# Patient Record
Sex: Male | Born: 1965 | Race: White | Hispanic: No | Marital: Married
Health system: Southern US, Community
[De-identification: ages and names within clinical notes are randomized; demographics above are authoritative.]

---

## 2015-05-25 ENCOUNTER — Emergency Department (HOSPITAL_COMMUNITY)
Admission: EM | Admit: 2015-05-25 | Discharge: 2015-05-26 | Disposition: A | Discharge status: Home / Self Care | Payer: BLUE CROSS/BLUE SHIELD | Attending: Emergency Medicine | Admitting: Emergency Medicine

## 2015-05-25 ENCOUNTER — Encounter (HOSPITAL_COMMUNITY): Payer: Self-pay | Admitting: Radiology

## 2015-05-25 ENCOUNTER — Emergency Department (HOSPITAL_COMMUNITY): Payer: BLUE CROSS/BLUE SHIELD

## 2015-05-25 DIAGNOSIS — S0093XA Contusion of unspecified part of head, initial encounter: Secondary | ICD-10-CM | POA: Insufficient documentation

## 2015-05-25 DIAGNOSIS — Y999 Unspecified external cause status: Secondary | ICD-10-CM | POA: Insufficient documentation

## 2015-05-25 DIAGNOSIS — S90811A Abrasion, right foot, initial encounter: Secondary | ICD-10-CM | POA: Diagnosis not present

## 2015-05-25 DIAGNOSIS — Y939 Activity, unspecified: Secondary | ICD-10-CM | POA: Insufficient documentation

## 2015-05-25 DIAGNOSIS — T07XXXA Unspecified multiple injuries, initial encounter: Secondary | ICD-10-CM

## 2015-05-25 DIAGNOSIS — R55 Syncope and collapse: Secondary | ICD-10-CM | POA: Insufficient documentation

## 2015-05-25 DIAGNOSIS — R109 Unspecified abdominal pain: Secondary | ICD-10-CM | POA: Insufficient documentation

## 2015-05-25 DIAGNOSIS — Z23 Encounter for immunization: Secondary | ICD-10-CM | POA: Insufficient documentation

## 2015-05-25 DIAGNOSIS — Y929 Unspecified place or not applicable: Secondary | ICD-10-CM | POA: Insufficient documentation

## 2015-05-25 DIAGNOSIS — S0081XA Abrasion of other part of head, initial encounter: Secondary | ICD-10-CM | POA: Diagnosis not present

## 2015-05-25 DIAGNOSIS — S7011XA Contusion of right thigh, initial encounter: Secondary | ICD-10-CM | POA: Diagnosis not present

## 2015-05-25 DIAGNOSIS — Z792 Long term (current) use of antibiotics: Secondary | ICD-10-CM | POA: Insufficient documentation

## 2015-05-25 DIAGNOSIS — F10920 Alcohol use, unspecified with intoxication, uncomplicated: Secondary | ICD-10-CM

## 2015-05-25 DIAGNOSIS — S0990XA Unspecified injury of head, initial encounter: Secondary | ICD-10-CM | POA: Diagnosis present

## 2015-05-25 DIAGNOSIS — S40022A Contusion of left upper arm, initial encounter: Secondary | ICD-10-CM | POA: Insufficient documentation

## 2015-05-25 DIAGNOSIS — S63125A Dislocation of unspecified interphalangeal joint of left thumb, initial encounter: Secondary | ICD-10-CM | POA: Diagnosis not present

## 2015-05-25 DIAGNOSIS — F10120 Alcohol abuse with intoxication, uncomplicated: Secondary | ICD-10-CM | POA: Diagnosis not present

## 2015-05-25 DIAGNOSIS — S63105A Unspecified dislocation of left thumb, initial encounter: Secondary | ICD-10-CM

## 2015-05-25 DIAGNOSIS — S2091XA Abrasion of unspecified parts of thorax, initial encounter: Secondary | ICD-10-CM | POA: Diagnosis not present

## 2015-05-25 LAB — I-STAT CHEM 8, ED
BUN: 19 mg/dL (ref 6–20)
Calcium, Ion: 0.99 mmol/L — ABNORMAL LOW (ref 1.12–1.23)
Chloride: 105 mmol/L (ref 101–111)
Creatinine, Ser: 1.1 mg/dL (ref 0.61–1.24)
GLUCOSE: 108 mg/dL — AB (ref 65–99)
HEMATOCRIT: 44 % (ref 39.0–52.0)
HEMOGLOBIN: 15 g/dL (ref 13.0–17.0)
POTASSIUM: 4.5 mmol/L (ref 3.5–5.1)
Sodium: 140 mmol/L (ref 135–145)
TCO2: 23 mmol/L (ref 0–100)

## 2015-05-25 LAB — ETHANOL: Alcohol, Ethyl (B): 246 mg/dL — ABNORMAL HIGH (ref <5)

## 2015-05-25 LAB — PROTIME-INR
INR: 0.98 (ref 0.00–1.49)
Prothrombin Time: 12.8 seconds (ref 11.6–15.2)

## 2015-05-25 LAB — COMPREHENSIVE METABOLIC PANEL
ALBUMIN: 4.2 g/dL (ref 3.5–5.0)
ALT: 23 U/L (ref 17–63)
AST: 33 U/L (ref 15–41)
Alkaline Phosphatase: 46 U/L (ref 38–126)
Anion gap: 11 (ref 5–15)
BILIRUBIN TOTAL: 0.4 mg/dL (ref 0.3–1.2)
BUN: 16 mg/dL (ref 6–20)
CHLORIDE: 103 mmol/L (ref 101–111)
CO2: 23 mmol/L (ref 22–32)
Calcium: 8.7 mg/dL — ABNORMAL LOW (ref 8.9–10.3)
Creatinine, Ser: 0.86 mg/dL (ref 0.61–1.24)
GFR calc Af Amer: >60 mL/min (ref >60)
GFR calc non Af Amer: >60 mL/min (ref >60)
GLUCOSE: 100 mg/dL — AB (ref 65–99)
POTASSIUM: 3.5 mmol/L (ref 3.5–5.1)
Sodium: 137 mmol/L (ref 135–145)
Total Protein: 7 g/dL (ref 6.5–8.1)

## 2015-05-25 LAB — CBC
HEMATOCRIT: 41.7 % (ref 39.0–52.0)
Hemoglobin: 14.2 g/dL (ref 13.0–17.0)
MCH: 31.1 pg (ref 26.0–34.0)
MCHC: 34.1 g/dL (ref 30.0–36.0)
MCV: 91.4 fL (ref 78.0–100.0)
Platelets: 332 10*3/uL (ref 150–400)
RBC: 4.56 MIL/uL (ref 4.22–5.81)
RDW: 12.3 % (ref 11.5–15.5)
WBC: 12.1 10*3/uL — ABNORMAL HIGH (ref 4.0–10.5)

## 2015-05-25 LAB — URINALYSIS, ROUTINE W REFLEX MICROSCOPIC
BILIRUBIN URINE: NEGATIVE
Glucose, UA: NEGATIVE mg/dL
HGB URINE DIPSTICK: NEGATIVE
Ketones, ur: NEGATIVE mg/dL
Leukocytes, UA: NEGATIVE
Nitrite: NEGATIVE
PH: 5.5 (ref 5.0–8.0)
Protein, ur: NEGATIVE mg/dL
SPECIFIC GRAVITY, URINE: 1.013 (ref 1.005–1.030)

## 2015-05-25 LAB — I-STAT CG4 LACTIC ACID, ED: Lactic Acid, Venous: 2.4 mmol/L (ref 0.5–2.0)

## 2015-05-25 LAB — SAMPLE TO BLOOD BANK

## 2015-05-25 MED ORDER — FENTANYL CITRATE (PF) 100 MCG/2ML IJ SOLN
50.0000 ug | Freq: Once | INTRAMUSCULAR | Status: AC
  Administered 2015-05-25: 50 ug via INTRAVENOUS
  Filled 2015-05-25: qty 2

## 2015-05-25 MED ORDER — TETANUS-DIPHTH-ACELL PERTUSSIS 5-2.5-18.5 LF-MCG/0.5 IM SUSP
0.5000 mL | Freq: Once | INTRAMUSCULAR | Status: AC
  Administered 2015-05-25: 0.5 mL via INTRAMUSCULAR
  Filled 2015-05-25: qty 0.5

## 2015-05-25 MED ORDER — LIDOCAINE HCL (PF) 1 % IJ SOLN
30.0000 mL | Freq: Once | INTRAMUSCULAR | Status: AC
  Administered 2015-05-25: 30 mL via INTRADERMAL
  Filled 2015-05-25: qty 30

## 2015-05-25 MED ORDER — IOPAMIDOL (ISOVUE-300) INJECTION 61%
100.0000 mL | Freq: Once | INTRAVENOUS | Status: AC | PRN
  Administered 2015-05-25: 100 mL via INTRAVENOUS

## 2015-05-25 MED ORDER — SODIUM CHLORIDE 0.9 % IV BOLUS (SEPSIS)
125.0000 mL | Freq: Once | INTRAVENOUS | Status: AC
  Administered 2015-05-25: 125 mL via INTRAVENOUS
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
## 2015-05-25 NOTE — ED Notes (Signed)
Patient stated he could not give a urine sample, made aware of need for sample and given a urinal

## 2015-05-26 ENCOUNTER — Emergency Department (HOSPITAL_COMMUNITY): Payer: BLUE CROSS/BLUE SHIELD

## 2015-05-26 MED ORDER — CEPHALEXIN 500 MG PO CAPS: 500.0000 mg | ORAL_CAPSULE | Freq: Four times a day (QID) | ORAL | Status: AC
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
## 2015-05-26 NOTE — ED Notes (Signed)
Pt ambulated in hall without difficulty.

## 2015-05-26 NOTE — Discharge Instructions (Signed)
1. Medications: keflex, usual home medications, tylenol or ibuprofen for pain control 2. Treatment: rest, drink plenty of fluids, keep wounds clean with warm soap and water; use ice for thumb and keep elevated 3. Follow Up: Please followup with your primary doctor in 2 days for discussion of your diagnoses and further evaluation after today's visit; if you do not have a primary care doctor use the resource guide provided to find one; Please return to the ER for worsening symptoms

## 2016-09-27 DIAGNOSIS — S8252XA Displaced fracture of medial malleolus of left tibia, initial encounter for closed fracture: Secondary | ICD-10-CM | POA: Diagnosis not present

## 2016-09-27 DIAGNOSIS — J45909 Unspecified asthma, uncomplicated: Secondary | ICD-10-CM | POA: Diagnosis not present

## 2016-09-27 DIAGNOSIS — R52 Pain, unspecified: Secondary | ICD-10-CM | POA: Diagnosis not present

## 2016-09-27 DIAGNOSIS — S99812A Other specified injuries of left ankle, initial encounter: Secondary | ICD-10-CM | POA: Diagnosis not present

## 2016-09-27 DIAGNOSIS — W11XXXA Fall on and from ladder, initial encounter: Secondary | ICD-10-CM | POA: Diagnosis not present

## 2016-09-27 DIAGNOSIS — M25572 Pain in left ankle and joints of left foot: Secondary | ICD-10-CM | POA: Diagnosis not present

## 2016-09-27 DIAGNOSIS — S82892A Other fracture of left lower leg, initial encounter for closed fracture: Secondary | ICD-10-CM | POA: Diagnosis not present

## 2016-10-01 DIAGNOSIS — M25572 Pain in left ankle and joints of left foot: Secondary | ICD-10-CM | POA: Diagnosis not present

## 2016-10-01 DIAGNOSIS — S93422A Sprain of deltoid ligament of left ankle, initial encounter: Secondary | ICD-10-CM | POA: Diagnosis not present

## 2016-12-16 DIAGNOSIS — H5789 Other specified disorders of eye and adnexa: Secondary | ICD-10-CM | POA: Diagnosis not present

## 2017-01-25 DIAGNOSIS — S161XXA Strain of muscle, fascia and tendon at neck level, initial encounter: Secondary | ICD-10-CM | POA: Diagnosis not present

## 2017-01-25 DIAGNOSIS — J029 Acute pharyngitis, unspecified: Secondary | ICD-10-CM | POA: Diagnosis not present

## 2017-01-25 DIAGNOSIS — J028 Acute pharyngitis due to other specified organisms: Secondary | ICD-10-CM | POA: Diagnosis not present

## 2017-05-03 DIAGNOSIS — N049 Nephrotic syndrome with unspecified morphologic changes: Secondary | ICD-10-CM | POA: Diagnosis not present

## 2017-05-03 DIAGNOSIS — R001 Bradycardia, unspecified: Secondary | ICD-10-CM | POA: Diagnosis not present

## 2017-05-03 DIAGNOSIS — R0602 Shortness of breath: Secondary | ICD-10-CM | POA: Diagnosis not present

## 2017-05-03 DIAGNOSIS — R600 Localized edema: Secondary | ICD-10-CM | POA: Diagnosis not present

## 2017-05-05 DIAGNOSIS — Z0000 Encounter for general adult medical examination without abnormal findings: Secondary | ICD-10-CM | POA: Diagnosis not present

## 2017-05-05 DIAGNOSIS — N049 Nephrotic syndrome with unspecified morphologic changes: Secondary | ICD-10-CM | POA: Diagnosis not present

## 2017-05-05 DIAGNOSIS — E782 Mixed hyperlipidemia: Secondary | ICD-10-CM | POA: Diagnosis not present

## 2017-05-05 DIAGNOSIS — E559 Vitamin D deficiency, unspecified: Secondary | ICD-10-CM | POA: Diagnosis not present

## 2017-05-05 DIAGNOSIS — R5383 Other fatigue: Secondary | ICD-10-CM | POA: Diagnosis not present

## 2017-05-05 DIAGNOSIS — Z125 Encounter for screening for malignant neoplasm of prostate: Secondary | ICD-10-CM | POA: Diagnosis not present

## 2017-06-25 DIAGNOSIS — K5730 Diverticulosis of large intestine without perforation or abscess without bleeding: Secondary | ICD-10-CM | POA: Diagnosis not present

## 2017-06-25 DIAGNOSIS — K635 Polyp of colon: Secondary | ICD-10-CM | POA: Diagnosis not present

## 2017-06-25 DIAGNOSIS — Z1211 Encounter for screening for malignant neoplasm of colon: Secondary | ICD-10-CM | POA: Diagnosis not present

## 2017-07-01 DIAGNOSIS — N049 Nephrotic syndrome with unspecified morphologic changes: Secondary | ICD-10-CM | POA: Diagnosis not present

## 2017-07-01 DIAGNOSIS — I10 Essential (primary) hypertension: Secondary | ICD-10-CM | POA: Diagnosis not present

## 2017-10-06 DIAGNOSIS — I10 Essential (primary) hypertension: Secondary | ICD-10-CM | POA: Diagnosis not present

## 2017-10-06 DIAGNOSIS — N049 Nephrotic syndrome with unspecified morphologic changes: Secondary | ICD-10-CM | POA: Diagnosis not present
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
## 2017-10-10 IMAGING — DX DG FINGER THUMB 2+V*L*
3 series · 3 of 3 positions shown · non-contrast
Comparison: Left thumb radiographs performed 05/25/2015

CLINICAL DATA: Status post reduction of left thumb dislocation.
Initial encounter.

EXAM:
LEFT THUMB 2+V

[finger ap]
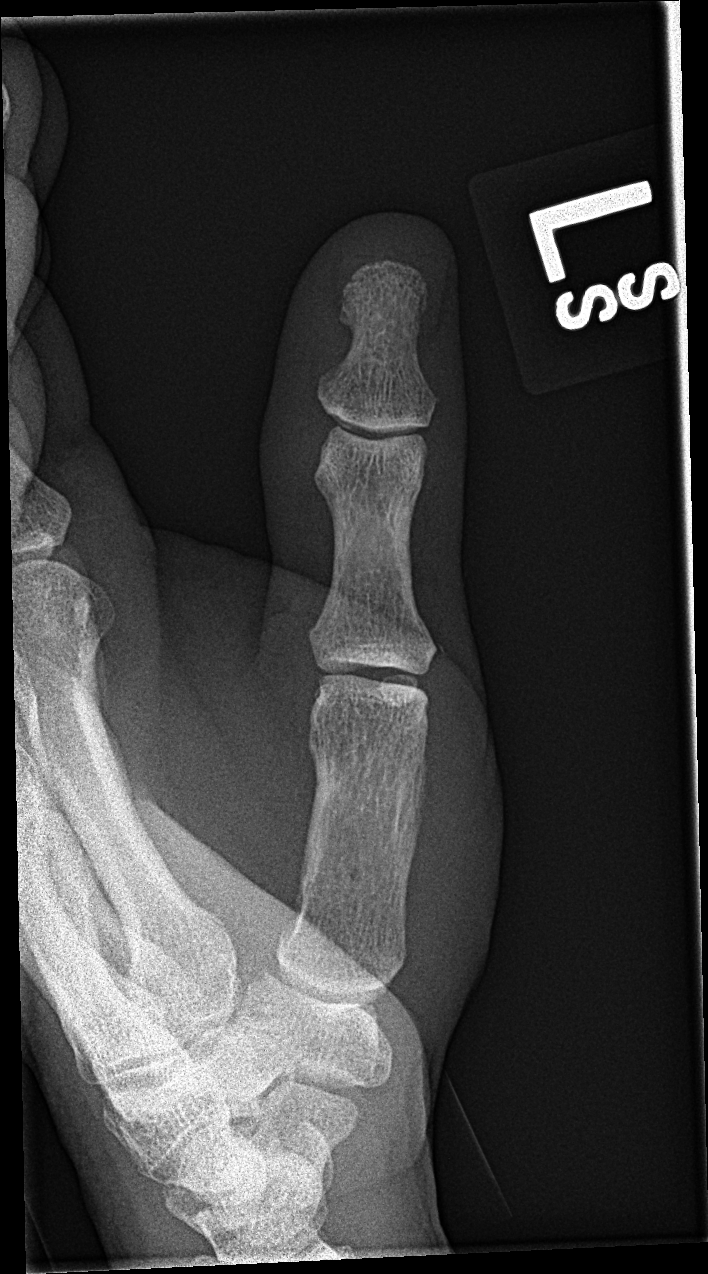

[finger lat]
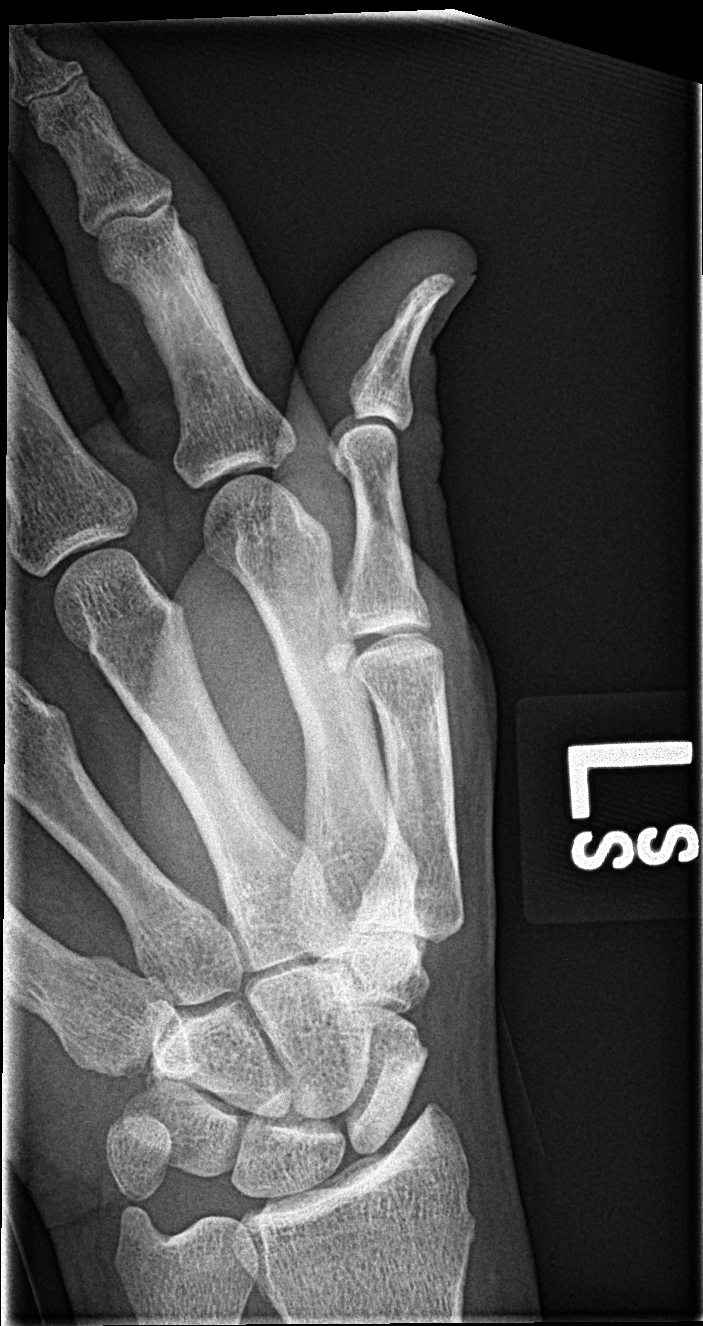

[finger obl]
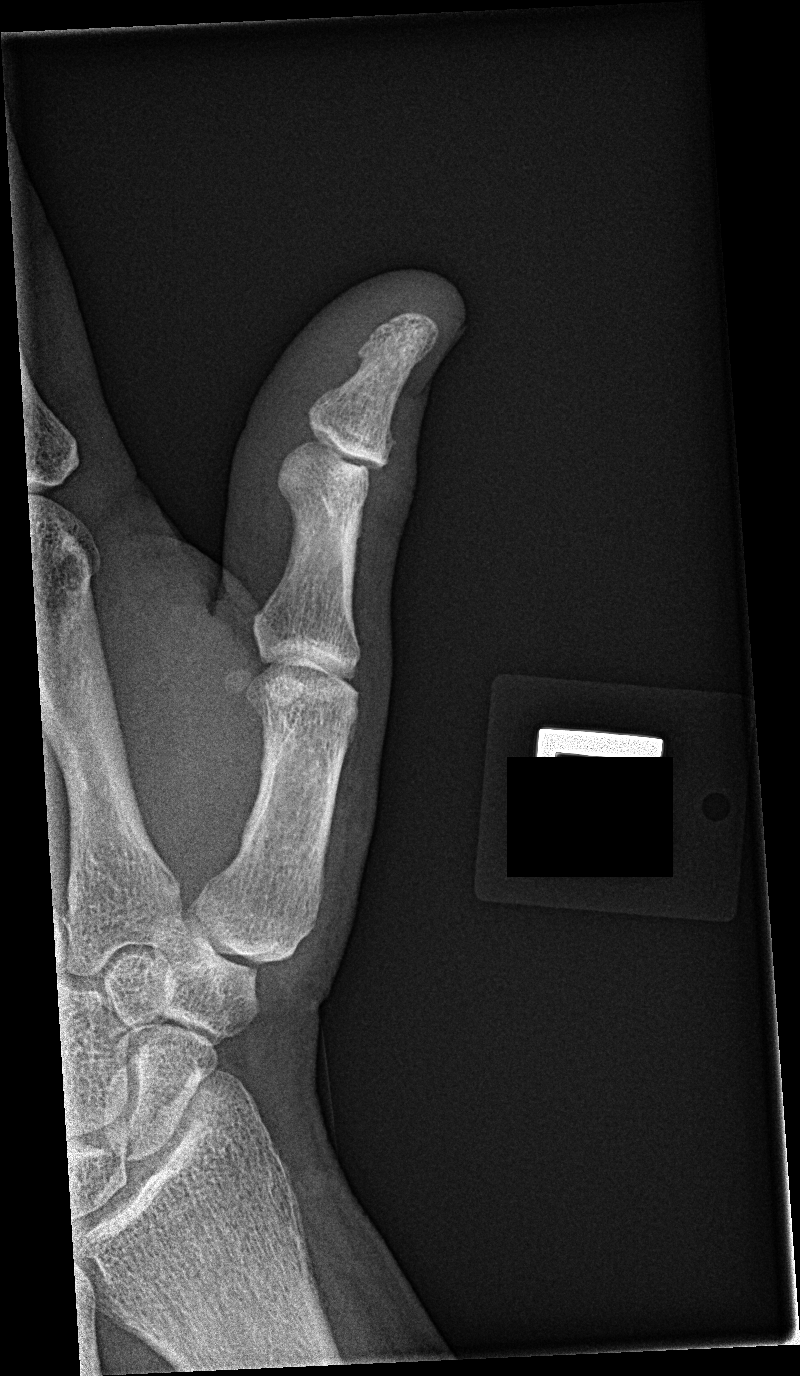

[3 of 3 positions shown; findings below may reference images not displayed]

FINDINGS: There has been successful reduction of the left first distal
phalanx. Mild soft tissue swelling is noted about the thumb. A tiny
osseous fragment adjacent to the base of the first proximal phalanx
could reflect a tiny avulsion injury.
IMPRESSION: Successful reduction of left first distal phalanx. Tiny osseous
fragment adjacent to the base of the first proximal phalanx could
reflect a tiny avulsion injury. Would correlate for any associated
symptoms.

## 2017-12-09 DIAGNOSIS — N049 Nephrotic syndrome with unspecified morphologic changes: Secondary | ICD-10-CM | POA: Diagnosis not present

## 2017-12-09 DIAGNOSIS — R319 Hematuria, unspecified: Secondary | ICD-10-CM | POA: Diagnosis not present

## 2017-12-26 ENCOUNTER — Encounter (HOSPITAL_COMMUNITY): Payer: Self-pay | Admitting: Emergency Medicine

## 2017-12-26 ENCOUNTER — Emergency Department (HOSPITAL_COMMUNITY)
Admission: EM | Admit: 2017-12-26 | Discharge: 2017-12-26 | Disposition: A | Discharge status: Home / Self Care | Payer: 59 | Attending: Emergency Medicine | Admitting: Emergency Medicine

## 2017-12-26 ENCOUNTER — Emergency Department (HOSPITAL_COMMUNITY): Payer: 59

## 2017-12-26 DIAGNOSIS — S92422B Displaced fracture of distal phalanx of left great toe, initial encounter for open fracture: Secondary | ICD-10-CM

## 2017-12-26 DIAGNOSIS — S91219A Laceration without foreign body of unspecified toe(s) with damage to nail, initial encounter: Secondary | ICD-10-CM

## 2017-12-26 DIAGNOSIS — Y939 Activity, unspecified: Secondary | ICD-10-CM | POA: Diagnosis not present

## 2017-12-26 DIAGNOSIS — W231XXA Caught, crushed, jammed, or pinched between stationary objects, initial encounter: Secondary | ICD-10-CM | POA: Insufficient documentation

## 2017-12-26 DIAGNOSIS — S91212A Laceration without foreign body of left great toe with damage to nail, initial encounter: Secondary | ICD-10-CM | POA: Insufficient documentation

## 2017-12-26 DIAGNOSIS — Z79899 Other long term (current) drug therapy: Secondary | ICD-10-CM | POA: Insufficient documentation

## 2017-12-26 DIAGNOSIS — Y999 Unspecified external cause status: Secondary | ICD-10-CM | POA: Diagnosis not present

## 2017-12-26 DIAGNOSIS — Y929 Unspecified place or not applicable: Secondary | ICD-10-CM | POA: Diagnosis not present

## 2017-12-26 DIAGNOSIS — S99922A Unspecified injury of left foot, initial encounter: Secondary | ICD-10-CM | POA: Diagnosis not present

## 2017-12-26 MED ORDER — HYDROCODONE-ACETAMINOPHEN 5-325 MG PO TABS: 1.0000 | ORAL_TABLET | Freq: Four times a day (QID) | ORAL | 0 refills | Status: AC | PRN

## 2017-12-26 MED ORDER — DIAZEPAM 5 MG PO TABS
5.0000 mg | ORAL_TABLET | Freq: Once | ORAL | Status: AC
  Administered 2017-12-26: 5 mg via ORAL
  Filled 2017-12-26: qty 1

## 2017-12-26 MED ORDER — LIDOCAINE HCL (PF) 1 % IJ SOLN
20.0000 mL | Freq: Once | INTRAMUSCULAR | Status: AC
  Administered 2017-12-26: 20 mL
  Filled 2017-12-26: qty 30

## 2017-12-26 MED ORDER — AMOXICILLIN-POT CLAVULANATE 875-125 MG PO TABS: 1.0000 | ORAL_TABLET | Freq: Two times a day (BID) | ORAL | 0 refills | Status: AC

## 2017-12-26 MED ORDER — HYDROCODONE-ACETAMINOPHEN 5-325 MG PO TABS
1.0000 | ORAL_TABLET | Freq: Once | ORAL | Status: AC
  Administered 2017-12-26: 1 via ORAL
  Filled 2017-12-26: qty 1

## 2017-12-26 MED ORDER — NAPROXEN 500 MG PO TABS: 500.0000 mg | ORAL_TABLET | Freq: Two times a day (BID) | ORAL | 0 refills | Status: AC | PRN

## 2017-12-26 NOTE — ED Triage Notes (Signed)
Pt reports dropped couple hundred pound railroad tie on left foot.

## 2017-12-26 NOTE — ED Notes (Signed)
Suture cart at bedside 

## 2017-12-26 NOTE — Discharge Instructions (Addendum)
Keep wound clean. Keep area covered with a topical antibiotic ointment and bandage, keep the gauze that was placed between your toenail in place and do NOT remove this; keep bandage dry, and do not submerge in water. Ice and elevate for additional pain and swelling relief. Alternate between naprosyn and norco as directed as needed for pain relief but don't drive or operate machinery while taking the narcotic pain medication. Take antibiotic as directed until completed. Follow up with the orthopedist in 3-4 days for wound recheck and ongoing management of your foot injury. Use post op shoe for comfort and protection. Monitor area for signs of infection to include, but not limited to: increasing pain, spreading redness, drainage/pus, worsening swelling, or fevers. Return to emergency department for emergent changing or worsening symptoms.

## 2017-12-26 NOTE — ED Provider Notes (Signed)
[facility]-EMERGENCY DEPT Provider Note   CSN: [id number] Arrival date & time: 12/26/17  [id number]     History   Chief Complaint Chief Complaint  Patient presents with  . Foot Injury    HPI Jacob BarcelonaJonathan D Nelson is a 52 y.o. male with a PMHx of nephrotic syndrome, who presents to the ED with complaints of left great toe injury sustained about 1 hour ago.  Patient states that he accidentally dropped a railroad tie on his foot which had a regular shoe on it.  He states that this object weighs about 200 pounds.  He describes his pain as 10/10 constant throbbing nonradiating left great toe pain that worsens with palpation to the area and with no treatments tried prior to arrival.  He sustained a laceration to the great toe, bleeding has been controlled.  He reports numbness/tingling in the left great toe.  He denies any other injuries or complaints at this time.  He is unsure of his last tetanus shot, but declines getting one.  He mentions that he is very afraid of needles and refuses any needle sticks at this time.  He states that in order to get any needle sticks he would need Valium. He does not have an orthopedist that he sees.   The history is provided by the patient and medical records. No language interpreter was used.  Foot Injury   Associated symptoms include numbness (L great toe).    History reviewed. No pertinent past medical history.  There are no active problems to display for this patient.   History reviewed. No pertinent surgical history.      Home Medications    Prior to Admission medications   Medication Sig Start Date End Date Taking? Authorizing Provider  cephALEXin (KEFLEX) 500 MG capsule Take 1 capsule (500 mg total) by mouth 4 (four) times daily. 05/26/15   Muthersbaugh, Dahlia ClientHannah, PA-C  predniSONE (DELTASONE) 10 MG tablet Take 10 mg by mouth daily. 05/22/15   [provider]    Family History No family history on file.  Social  History Social History   Tobacco Use  . Smoking status: Not on file  . Smokeless tobacco: Never Used  Substance Use Topics  . Alcohol use: Yes  . Drug use: Not on file     Allergies   Patient has no known allergies.   Review of Systems Review of Systems  Musculoskeletal: Positive for arthralgias and myalgias.  Skin: Positive for wound.  Allergic/Immunologic: Positive for immunocompromised state (on prednisone).  Neurological: Positive for numbness (L great toe).     Physical Exam Updated Vital Signs BP (!) 163/116 (BP Location: Right Arm)   Pulse 82   Temp 97.9 F (36.6 C) (Oral)   Resp (!) 23   Ht 5\' 7"  (1.702 m)   Wt 86.2 kg   SpO2 98%   BMI 29.76 kg/m   Physical Exam Vitals signs and nursing note reviewed.  Constitutional:      General: He is in acute distress (in pain).     Appearance: Normal appearance. He is well-developed. He is not toxic-appearing.     Comments: Afebrile, nontoxic, in pain  HENT:     Head: Normocephalic and atraumatic.  Eyes:     General:        Right eye: No discharge.        Left eye: No discharge.     Conjunctiva/sclera: Conjunctivae normal.  Neck:     Musculoskeletal: Normal range of motion  and neck supple.  Cardiovascular:     Rate and Rhythm: Normal rate.  Pulmonary:     Effort: Pulmonary effort is normal. No respiratory distress.  Abdominal:     General: There is no distension.  Musculoskeletal:     Left foot: Decreased range of motion (L great toe, due to pain). Tenderness and laceration present.     Comments: L great toe with jagged laceration from the nail bed to the medial edge of the toe, ~3cm in length, with nail completely detached from nailbed, slight oozing bleeding but no arterial bleeding. SEE PICTURE BELOW. Cap refill can't be assessed due to nail injury. Pt able to wiggle toe slightly but ROM unable to be fully assessed due to pain and wound. Exquisite TTP to the lacerated area but otherwise no tenderness to  remainder of toe or foot/ankle/leg. No gross deformity of the toe, no definite crepitus but this portion of exam is limited due to pain.  Sensation grossly intact. DP pulses intact. Compartments soft.   Skin:    General: Skin is warm and dry.     Findings: Laceration present. No rash.     Comments: L great toe lac as mentioned above and pictured below  Neurological:     Mental Status: He is alert and oriented to person, place, and time.     Sensory: Sensation is intact. No sensory deficit.     Motor: Motor function is intact.  Psychiatric:        Mood and Affect: Mood and affect normal.        Behavior: Behavior normal.          ED Treatments / Results  Labs (all labs ordered are listed, but only abnormal results are displayed) Labs Reviewed - No data to display  EKG None  Radiology Dg Foot Complete Left  Result Date: 12/26/2017 CLINICAL DATA:  Crush injury to left great toe. EXAM: LEFT FOOT - COMPLETE 3+ VIEW COMPARISON:  None. FINDINGS: Comminuted fracture involving the distal phalanx of the great toe evident. Fracture involves the tuft with a sagittally oriented fracture through the medial base of the phalanx extending into the articular surface. Overlying soft tissue laceration evident. IMPRESSION: Comminuted fracture involving the distal phalanx of the great toe with overlying skin laceration. Electronically Signed   By: Kennith CenterEric  Mansell M.D.   On: 12/26/2017 19:31    Procedures .Marland Kitchen.Laceration Repair Date/Time: 12/26/2017 8:35 PM Performed by: Rhona RaiderStreet, Ilyaas Musto, PA-C Authorized by: Rhona RaiderStreet, Drinda Belgard, [place]PA-C   Consent:    Consent obtained:  Verbal   Consent given by:  Patient   Risks discussed:  Pain, poor cosmetic result, poor wound healing, need for additional repair and infection   Alternatives discussed:  No treatment Anesthesia (see MAR for exact dosages):    Anesthesia method:  Local infiltration   Local anesthetic:  Lidocaine 1% w/o epi Laceration details:     Location:  Toe   Toe location:  L big toe   Length (cm):  3   Depth (mm):  10 Repair type:    Repair type:  Intermediate Pre-procedure details:    Preparation:  Patient was prepped and draped in usual sterile fashion and imaging obtained to evaluate for foreign bodies Exploration:    Hemostasis achieved with:  Direct pressure   Wound exploration: wound explored through full range of motion and entire depth of wound probed and visualized     Wound extent: underlying fracture     Wound extent comment:  Nailbed injury  Contaminated: no   Treatment:    Area cleansed with:  Saline   Amount of cleaning:  Extensive   Irrigation solution:  Sterile saline   Irrigation volume:  1L   Irrigation method:  Syringe Subcutaneous repair:    Suture size:  4-0   Suture material:  Chromic gut   Suture technique:  Simple interrupted   Number of sutures:  3 Skin repair:    Repair method:  Sutures   Suture size:  4-0   Suture material:  Nylon   Suture technique:  Simple interrupted   Number of sutures:  6 Approximation:    Approximation:  Close Post-procedure details:    Dressing:  Non-adherent dressing and bulky dressing   Patient tolerance of procedure:  Tolerated well, no immediate complications   (including critical care time)  Medications Ordered in ED Medications  lidocaine (PF) (XYLOCAINE) 1 % injection 20 mL (has no administration in time range)  diazepam (VALIUM) tablet 5 mg (5 mg Oral Given 12/26/17 1859)  HYDROcodone-acetaminophen (NORCO/VICODIN) 5-325 MG per tablet 1 tablet (1 tablet Oral Given 12/26/17 1859)     Initial Impression / Assessment and Plan / ED Course  I have reviewed the triage vital signs and the nursing notes.  Pertinent labs & imaging results that were available during my care of the patient were reviewed by me and considered in my medical decision making (see chart for details).     52 y.o. male here with L great toe injury after dropping heavy object on  it. He has a ~3cm jagged laceration to the great toe which goes from the nail bed towards the medial aspect of the toe, nail completely detached from nail bed, slowly oozing blood but otherwise no arterial bleeding, sensation grossly intact, soft compartments in the toe, cap refill can't be assessed but DP pulse strong and intact. Will get xray. Pt afraid of needles and adamantly refuses it; will give PO meds for now, including valium and norco. Will attempt to provide digital block afterwards and assess the damage to the toe, and plan for potentially repairing it if possible. Will reassess shortly.   7:38 PM Xray confirming comminuted fx of distal phalanx of great toe. Will talk to orthopedist about this case. Pt feeling better, but still declining Tdap shot. Will reassess shortly.   7:50 PM Dr. Susa Simmonds of orthopedics returning page, recommends repair in ED and place on abx then f/up with him in the office this week. Will proceed with repair.   9:16 PM Wound repaired, 3 sutures of 4-0 chromic gut used to repair nail bed, then 6 total sutures of nylon 4-0 used to close the skin wound. Of note, during digital block, pt had significant bradycardia, but was mentating fine during the procedure and after about of rest his HR improved and we continued with the procedure. Adequate hemostasis and best cosmesis possible achieved. Nail was left hanging on by a small piece of skin/cuticle, and flapped over the nailbed in order to provide protection. Petrolatum gauze was used between the nailbed and the nail in order to prevent sticking down, and to provide extra protection as well. Advised proper wound care, will send home with pain meds and abx, post op shoe given, advised f/up with ortho in 3-4 days for recheck and for ongoing management of his injury and fracture. Pt again offered the Tdap, he continues to decline; R/B/A advised and pt continues to decline. Will defer Tdap for now. I explained the diagnosis  and have given explicit precautions to return to the ER including for any other new or worsening symptoms. The patient understands and accepts the medical plan as it's been dictated and I have answered their questions. Discharge instructions concerning home care and prescriptions have been given. The patient is STABLE and is discharged to home in good condition.      Final Clinical Impressions(s) / ED Diagnoses   Final diagnoses:  Open displaced fracture of distal phalanx of left great toe, initial encounter  Laceration of nail bed of toe, initial encounter  Laceration of left great toe without foreign body with damage to nail, initial encounter    ED Discharge Orders         Ordered    naproxen (NAPROSYN) 500 MG tablet  2 times daily PRN     12/26/17 2110    HYDROcodone-acetaminophen (NORCO) 5-325 MG tablet  Every 6 hours PRN     12/26/17 2110    amoxicillin-clavulanate (AUGMENTIN) 875-125 MG tablet  2 times daily     12/26/17 [address], [place], Cordelia Poche 12/26/17 2117    Jacalyn Lefevre, MD 12/26/17 2307

## 2017-12-27 DIAGNOSIS — S92425B Nondisplaced fracture of distal phalanx of left great toe, initial encounter for open fracture: Secondary | ICD-10-CM | POA: Diagnosis not present

## 2020-03-07 DIAGNOSIS — N049 Nephrotic syndrome with unspecified morphologic changes: Secondary | ICD-10-CM | POA: Diagnosis not present

## 2020-03-07 DIAGNOSIS — I10 Essential (primary) hypertension: Secondary | ICD-10-CM | POA: Diagnosis not present

## 2020-05-12 IMAGING — DX DG FOOT COMPLETE 3+V*L*
3 series · 3 of 3 positions shown · non-contrast
Comparison: None.

CLINICAL DATA: Crush injury to left great toe.

EXAM:
LEFT FOOT - COMPLETE 3+ VIEW

[foot ap]
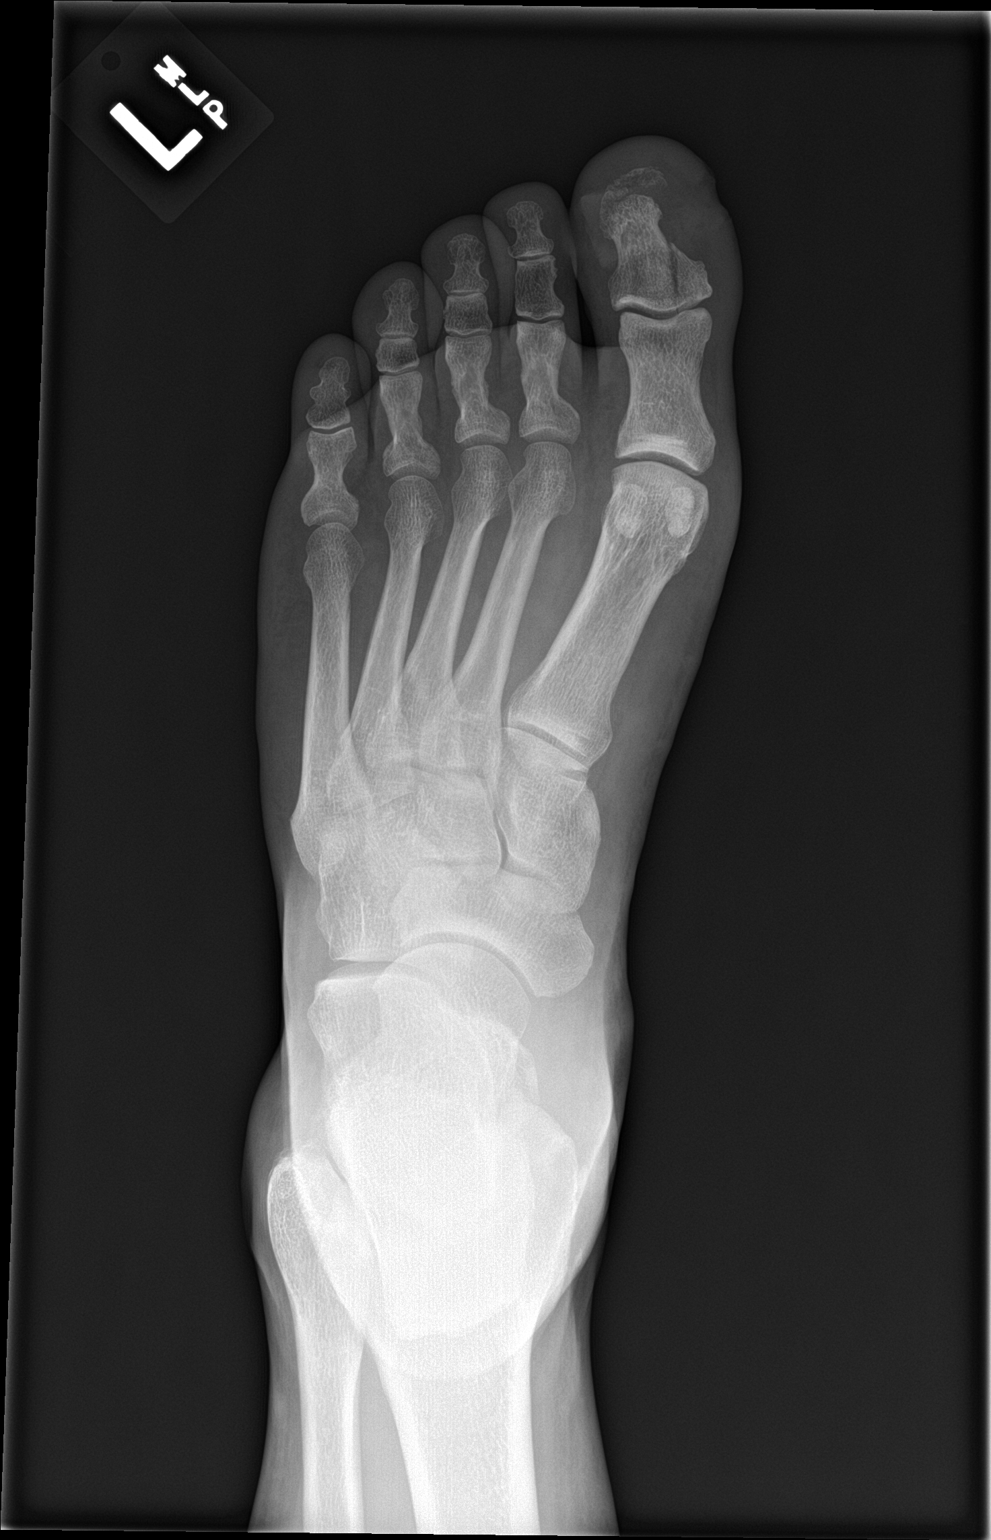

[foot obl]
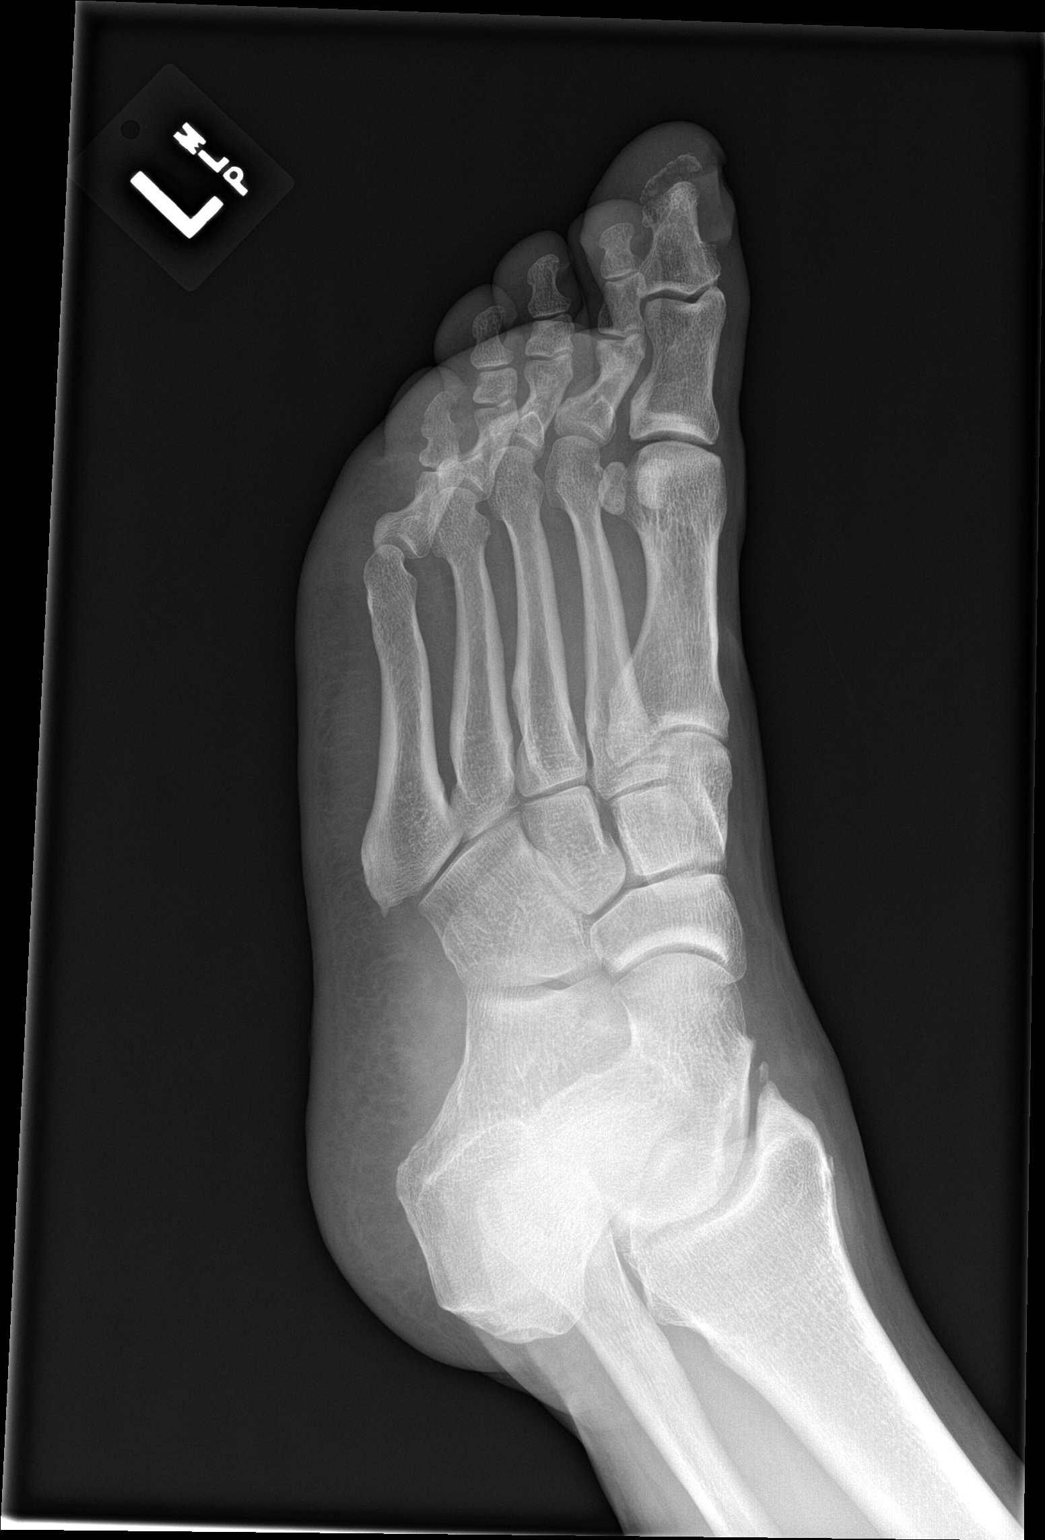

[foot lat]
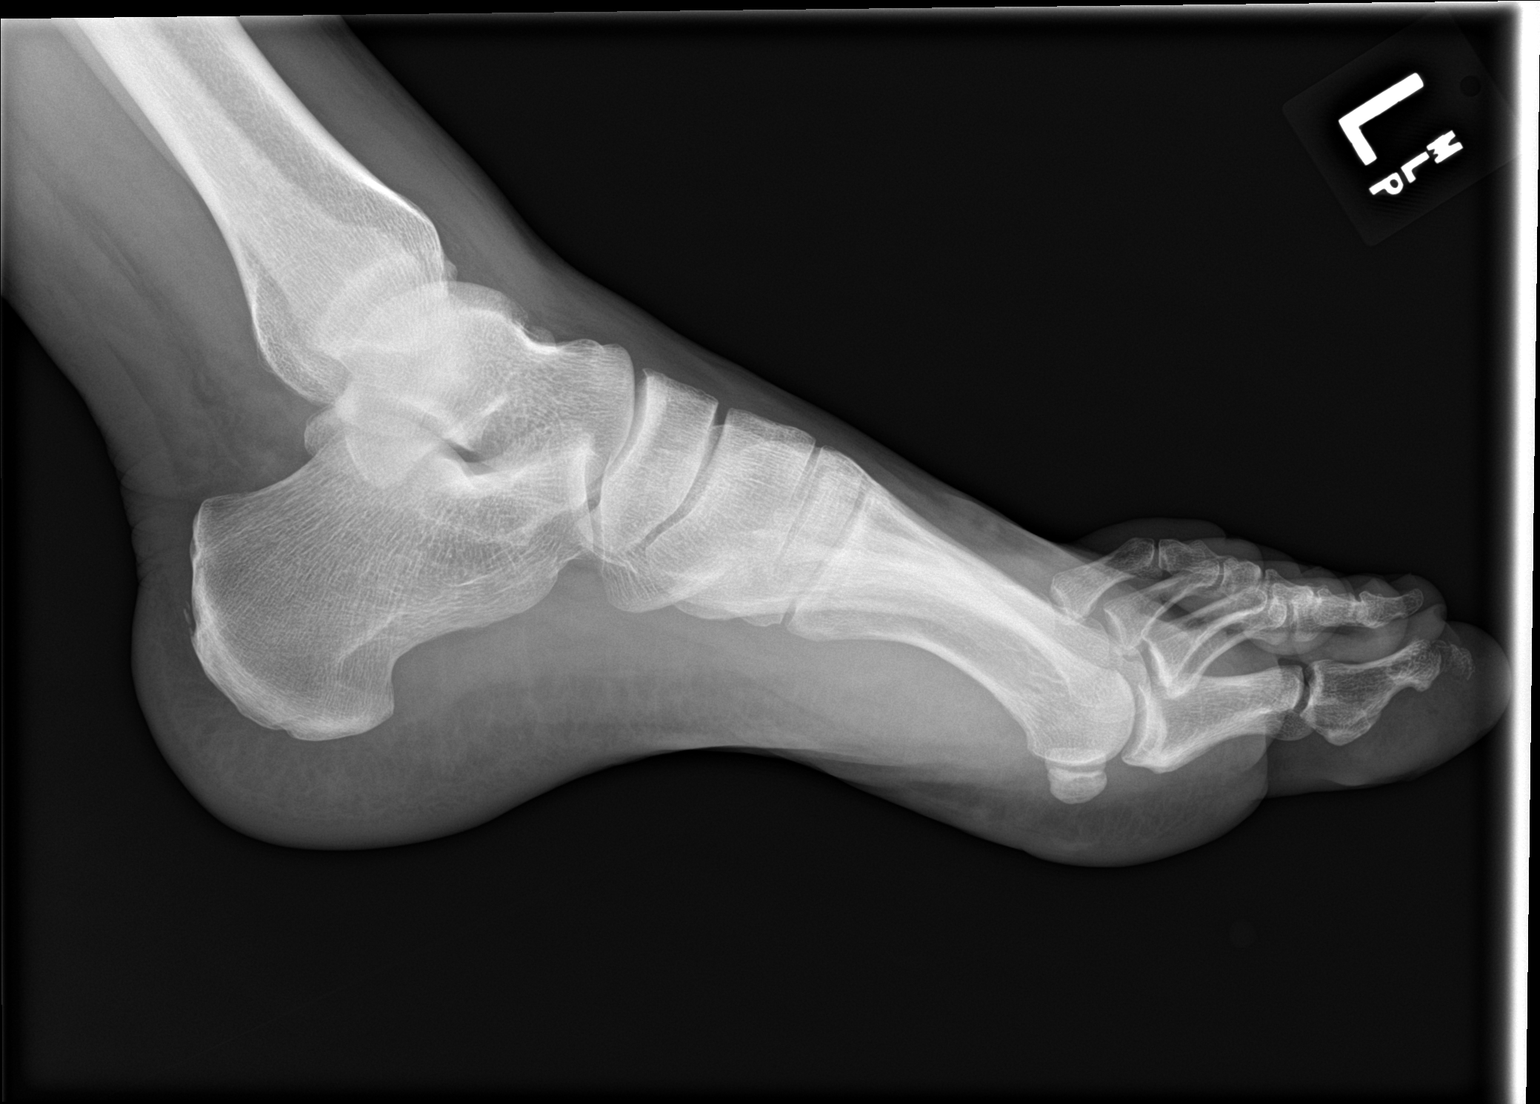

[3 of 3 positions shown; findings below may reference images not displayed]

FINDINGS: Comminuted fracture involving the distal phalanx of the great toe
evident. Fracture involves the tuft with a sagittally oriented
fracture through the medial base of the phalanx extending into the
articular surface. Overlying soft tissue laceration evident.
IMPRESSION: Comminuted fracture involving the distal phalanx of the great toe
with overlying skin laceration..

## 2021-03-12 DIAGNOSIS — N049 Nephrotic syndrome with unspecified morphologic changes: Secondary | ICD-10-CM | POA: Diagnosis not present

## 2021-03-12 DIAGNOSIS — I10 Essential (primary) hypertension: Secondary | ICD-10-CM | POA: Diagnosis not present

## 2021-12-01 DIAGNOSIS — F40298 Other specified phobia: Secondary | ICD-10-CM | POA: Diagnosis not present

## 2021-12-01 DIAGNOSIS — F419 Anxiety disorder, unspecified: Secondary | ICD-10-CM | POA: Diagnosis not present

## 2021-12-01 DIAGNOSIS — E559 Vitamin D deficiency, unspecified: Secondary | ICD-10-CM | POA: Diagnosis not present

## 2021-12-01 DIAGNOSIS — N049 Nephrotic syndrome with unspecified morphologic changes: Secondary | ICD-10-CM | POA: Diagnosis not present

## 2021-12-04 DIAGNOSIS — E559 Vitamin D deficiency, unspecified: Secondary | ICD-10-CM | POA: Diagnosis not present

## 2021-12-04 DIAGNOSIS — N049 Nephrotic syndrome with unspecified morphologic changes: Secondary | ICD-10-CM | POA: Diagnosis not present

## 2021-12-04 DIAGNOSIS — I10 Essential (primary) hypertension: Secondary | ICD-10-CM | POA: Diagnosis not present

## 2021-12-04 DIAGNOSIS — E782 Mixed hyperlipidemia: Secondary | ICD-10-CM | POA: Diagnosis not present

## 2021-12-04 DIAGNOSIS — E669 Obesity, unspecified: Secondary | ICD-10-CM | POA: Diagnosis not present

## 2021-12-04 DIAGNOSIS — N182 Chronic kidney disease, stage 2 (mild): Secondary | ICD-10-CM | POA: Diagnosis not present

## 2021-12-04 DIAGNOSIS — Z125 Encounter for screening for malignant neoplasm of prostate: Secondary | ICD-10-CM | POA: Diagnosis not present

## 2021-12-08 DIAGNOSIS — Z0000 Encounter for general adult medical examination without abnormal findings: Secondary | ICD-10-CM | POA: Diagnosis not present

## 2021-12-22 DIAGNOSIS — I10 Essential (primary) hypertension: Secondary | ICD-10-CM | POA: Diagnosis not present

## 2022-03-09 DIAGNOSIS — I10 Essential (primary) hypertension: Secondary | ICD-10-CM | POA: Diagnosis not present

## 2022-03-09 DIAGNOSIS — E782 Mixed hyperlipidemia: Secondary | ICD-10-CM | POA: Diagnosis not present

## 2022-03-18 DIAGNOSIS — N049 Nephrotic syndrome with unspecified morphologic changes: Secondary | ICD-10-CM | POA: Diagnosis not present

## 2022-06-10 DIAGNOSIS — N049 Nephrotic syndrome with unspecified morphologic changes: Secondary | ICD-10-CM | POA: Diagnosis not present

## 2022-06-10 DIAGNOSIS — F40298 Other specified phobia: Secondary | ICD-10-CM | POA: Diagnosis not present

## 2022-06-10 DIAGNOSIS — E782 Mixed hyperlipidemia: Secondary | ICD-10-CM | POA: Diagnosis not present

## 2023-08-19 DIAGNOSIS — N049 Nephrotic syndrome with unspecified morphologic changes: Secondary | ICD-10-CM | POA: Diagnosis not present

## 2023-08-19 DIAGNOSIS — I10 Essential (primary) hypertension: Secondary | ICD-10-CM | POA: Diagnosis not present
